# Patient Record
Sex: Male | Born: 1965 | Race: White | Hispanic: No | Smoking: Never smoker
Health system: Southern US, Community
[De-identification: ages and names within clinical notes are randomized; demographics above are authoritative.]

## PROBLEM LIST (undated history)

## (undated) DIAGNOSIS — I10 Essential (primary) hypertension: Secondary | ICD-10-CM

## (undated) DIAGNOSIS — E119 Type 2 diabetes mellitus without complications: Secondary | ICD-10-CM

## (undated) DIAGNOSIS — I2510 Atherosclerotic heart disease of native coronary artery without angina pectoris: Secondary | ICD-10-CM

## (undated) DIAGNOSIS — N289 Disorder of kidney and ureter, unspecified: Secondary | ICD-10-CM

---

## 2016-10-11 ENCOUNTER — Emergency Department (HOSPITAL_COMMUNITY): Payer: Self-pay

## 2016-10-11 ENCOUNTER — Encounter (HOSPITAL_COMMUNITY): Payer: Self-pay

## 2016-10-11 ENCOUNTER — Emergency Department (HOSPITAL_COMMUNITY)
Admission: EM | Admit: 2016-10-11 | Discharge: 2016-10-11 | Disposition: A | Discharge status: Home / Self Care | Payer: Self-pay | Attending: Emergency Medicine | Admitting: Emergency Medicine

## 2016-10-11 DIAGNOSIS — Z992 Dependence on renal dialysis: Secondary | ICD-10-CM | POA: Insufficient documentation

## 2016-10-11 DIAGNOSIS — I2510 Atherosclerotic heart disease of native coronary artery without angina pectoris: Secondary | ICD-10-CM | POA: Insufficient documentation

## 2016-10-11 DIAGNOSIS — R600 Localized edema: Secondary | ICD-10-CM | POA: Insufficient documentation

## 2016-10-11 DIAGNOSIS — E1122 Type 2 diabetes mellitus with diabetic chronic kidney disease: Secondary | ICD-10-CM | POA: Insufficient documentation

## 2016-10-11 DIAGNOSIS — N186 End stage renal disease: Secondary | ICD-10-CM | POA: Insufficient documentation

## 2016-10-11 DIAGNOSIS — I120 Hypertensive chronic kidney disease with stage 5 chronic kidney disease or end stage renal disease: Secondary | ICD-10-CM | POA: Insufficient documentation

## 2016-10-11 HISTORY — DX: Disorder of kidney and ureter, unspecified: N28.9

## 2016-10-11 HISTORY — DX: Essential (primary) hypertension: I10

## 2016-10-11 HISTORY — DX: Type 2 diabetes mellitus without complications: E11.9

## 2016-10-11 HISTORY — DX: Atherosclerotic heart disease of native coronary artery without angina pectoris: I25.10

## 2016-10-11 LAB — BASIC METABOLIC PANEL
Anion gap: 8 (ref 5–15)
BUN: 29 mg/dL — AB (ref 6–20)
CALCIUM: 8.5 mg/dL — AB (ref 8.9–10.3)
CHLORIDE: 105 mmol/L (ref 101–111)
CO2: 23 mmol/L (ref 22–32)
CREATININE: 3.05 mg/dL — AB (ref 0.61–1.24)
GFR calc Af Amer: 26 mL/min — ABNORMAL LOW (ref >60)
GFR calc non Af Amer: 22 mL/min — ABNORMAL LOW (ref >60)
Glucose, Bld: 148 mg/dL — ABNORMAL HIGH (ref 65–99)
Potassium: 3.1 mmol/L — ABNORMAL LOW (ref 3.5–5.1)
SODIUM: 136 mmol/L (ref 135–145)

## 2016-10-11 LAB — CBC
HEMATOCRIT: 25.1 % — AB (ref 39.0–52.0)
HEMOGLOBIN: 8.6 g/dL — AB (ref 13.0–17.0)
MCH: 33.2 pg (ref 26.0–34.0)
MCHC: 34.3 g/dL (ref 30.0–36.0)
MCV: 96.9 fL (ref 78.0–100.0)
Platelets: 82 10*3/uL — ABNORMAL LOW (ref 150–400)
RBC: 2.59 MIL/uL — ABNORMAL LOW (ref 4.22–5.81)
RDW: 14.7 % (ref 11.5–15.5)
WBC: 2.7 10*3/uL — ABNORMAL LOW (ref 4.0–10.5)

## 2016-10-11 NOTE — ED Notes (Signed)
Patient transported to X-ray 

## 2016-10-11 NOTE — ED Provider Notes (Signed)
[facility]-EMERGENCY DEPT Provider Note   CSN: [id number] Arrival date & time: 10/11/16  1732     History   Chief Complaint Chief Complaint  Patient presents with  . Foot Pain    HPI Nicholas Orr is a 50 y.o. male.  HPI  Pt with hx of renal failure on dialysis presents with co bilateral foot pain. Pt states he gets dialysis in [place] and lives in [place].  He currently came to the [place] area to surprise a friend.  Thinks he will move to [place].  Has missed approx 1 week of dialysis and is currently homeless.  Pt states the bottoms of both of his feet hurt- he has been doing a lot of walking.  Lower extrmities are swollen but he states not more than his usual.  Denies shortness of breath.  No fever/no chest pain. No vomiting.  There are no other associated systemic symptoms, there are no other alleviating or modifying factors.  Bottoms of feet feel like they are sore- has been going on the past few days. No specific injury or break in skin.    Past Medical History:  Diagnosis Date  . Coronary artery disease   . Diabetes mellitus without complication (HCC)   . Hypertension   . Renal disorder     There are no active problems to display for this patient.   No past surgical history on file.     Home Medications    Prior to Admission medications   Not on File    Family History History reviewed. No pertinent family history.  Social History Social History  Substance Use Topics  . Smoking status: Never Smoker  . Smokeless tobacco: Never Used  . Alcohol use No     Allergies   Patient has no known allergies.   Review of Systems Review of Systems  ROS reviewed and all otherwise negative except for mentioned in HPI   Physical Exam Updated Vital Signs BP 159/67 (BP Location: Left Arm)   Pulse 81   Temp 98.1 F (36.7 C) (Oral)   Resp 18   Ht 6' (1.829 m)   SpO2 100%  Vitals reviewed Physical Exam Physical Examination: General appearance - alert,  well appearing, and in no distress Mental status - alert, oriented to person, place, and time Eyes - no conjunctival injection ,no scleral icterus Chest - clear to auscultation, no wheezes, rales or rhonchi, symmetric air entry, normal respiratory effort Heart - normal rate, regular rhythm, normal S1, S2, no murmurs, rubs, clicks or gallops Abdomen - soft, nontender, nondistended, no masses or organomegaly Neurological - alert, oriented, normal speech, no focal findings Musculoskeletal - no joint tenderness, deformity or swelling, ttp diffusely over skin of soles of feet, no joint or bony point tenderness Extremities - peripheral pulses normal, 2+ pedal edema, no clubbing or cyanosis Skin - normal coloration and turgor, no rashes, no skin breakdown, skin is warm and dry  ED Treatments / Results  Labs (all labs ordered are listed, but only abnormal results are displayed) Labs Reviewed  CBC - Abnormal; Notable for the following:       Result Value   WBC 2.7 (*)    RBC 2.59 (*)    Hemoglobin 8.6 (*)    HCT 25.1 (*)    Platelets 82 (*)    All other components within normal limits  BASIC METABOLIC PANEL - Abnormal; Notable for the following:    Potassium 3.1 (*)    Glucose, Bld 148 (*)  BUN 29 (*)    Creatinine, Ser 3.05 (*)    Calcium 8.5 (*)    GFR calc non Af Amer 22 (*)    GFR calc Af Amer 26 (*)    All other components within normal limits    EKG  EKG Interpretation None       Radiology Dg Chest 2 View  Result Date: 10/11/2016 CLINICAL DATA:  Swelling and pain in both feet, missed dialysis EXAM: CHEST  2 VIEW COMPARISON:  None FINDINGS: Upper normal heart size. Mediastinal contours and pulmonary vascularity normal. Small RIGHT pleural effusion. No gross infiltrate or pneumothorax. Bones unremarkable. IMPRESSION: Small RIGHT pleural effusion without definite pulmonary infiltrate. Electronically Signed   By: Ulyses SouthwardMark  Boles M.D.   On: 10/11/2016 19:09     Procedures Procedures (including critical care time)  Medications Ordered in ED Medications - No data to display   Initial Impression / Assessment and Plan / ED Course  I have reviewed the triage vital signs and the nursing notes.  Pertinent labs & imaging results that were available during my care of the patient were reviewed by me and considered in my medical decision making (see chart for details).  Clinical Course     Pt presenting with c/o bilateral foot pain- on the soles of his feet.  He has lower extremity swelling which he states is chronic for him.  He has missed several dialysis sessions but does not have c/o shortness of breath.  Labs reveal renal failure, but low potassium.  CXR is also reassuring.  There is no skin breakdown on bottoms of feet- no signs of specific injury or cellulitis.  Skin is warm and dry- normal DP pulses in both feet. Pt has been walking more due [place] being homeless [place].  States he may be going to [place] next week.  Discharged with strict return precautions.  Pt agreeable with plan.  Final Clinical Impressions(s) / ED Diagnoses   Final diagnoses:  Bilateral lower extremity edema  End stage renal failure on dialysis [facility](HCC)    New Prescriptions There are no discharge medications for this patient.    Jerelyn ScottMartha Linker, MD 10/12/16 [phone]

## 2016-10-11 NOTE — ED Triage Notes (Signed)
Pt. Is homeless and has been outside .  Is having bilateral foot pain due to swelling .  Pt. Is also a dialysis pt. And his last treatment was last Wednesday.  Skin is pink warm and dry.   Pt. Is a diabetic and his last meal was this am.  Pt. Is alert and oriented X4.

## 2016-10-11 NOTE — Discharge Instructions (Signed)
Return to the ED with any concerns including difficulty breathing, fainting, chest pain, vomiting and not able to keep down liquids, decreased level of alertness/lethargy, or any other alarming symptoms °

## 2016-10-11 NOTE — ED Notes (Signed)
Pt is from out of town.  Last dialysis 1 week ago.  Pt c/o pain and swelling in bilateral feet.

## 2017-12-07 IMAGING — DX DG CHEST 2V
2 series · 2 of 2 positions shown · non-contrast
Comparison: None

CLINICAL DATA: Swelling and pain in both feet, missed dialysis

EXAM:
CHEST  2 VIEW

[chest pa]
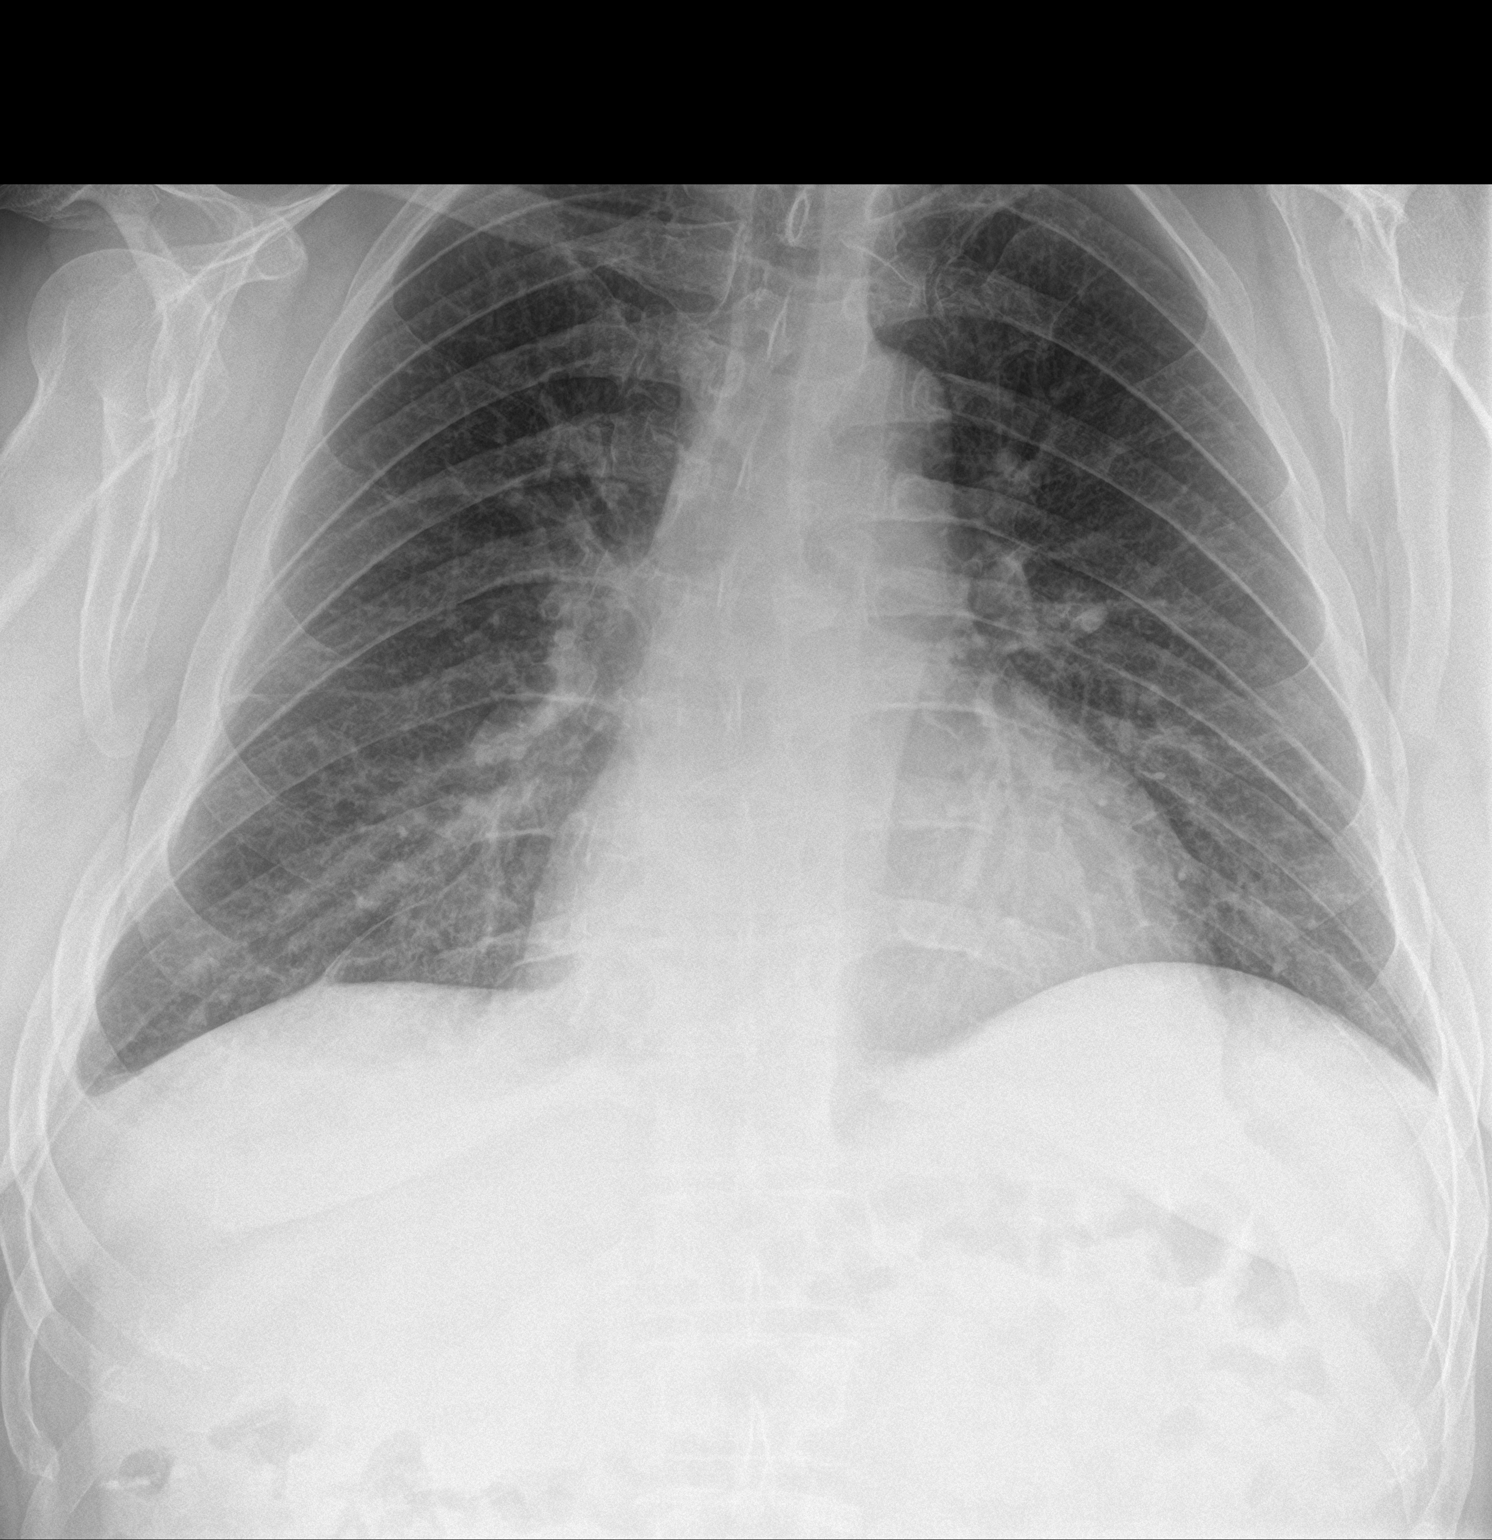

[chest lat]
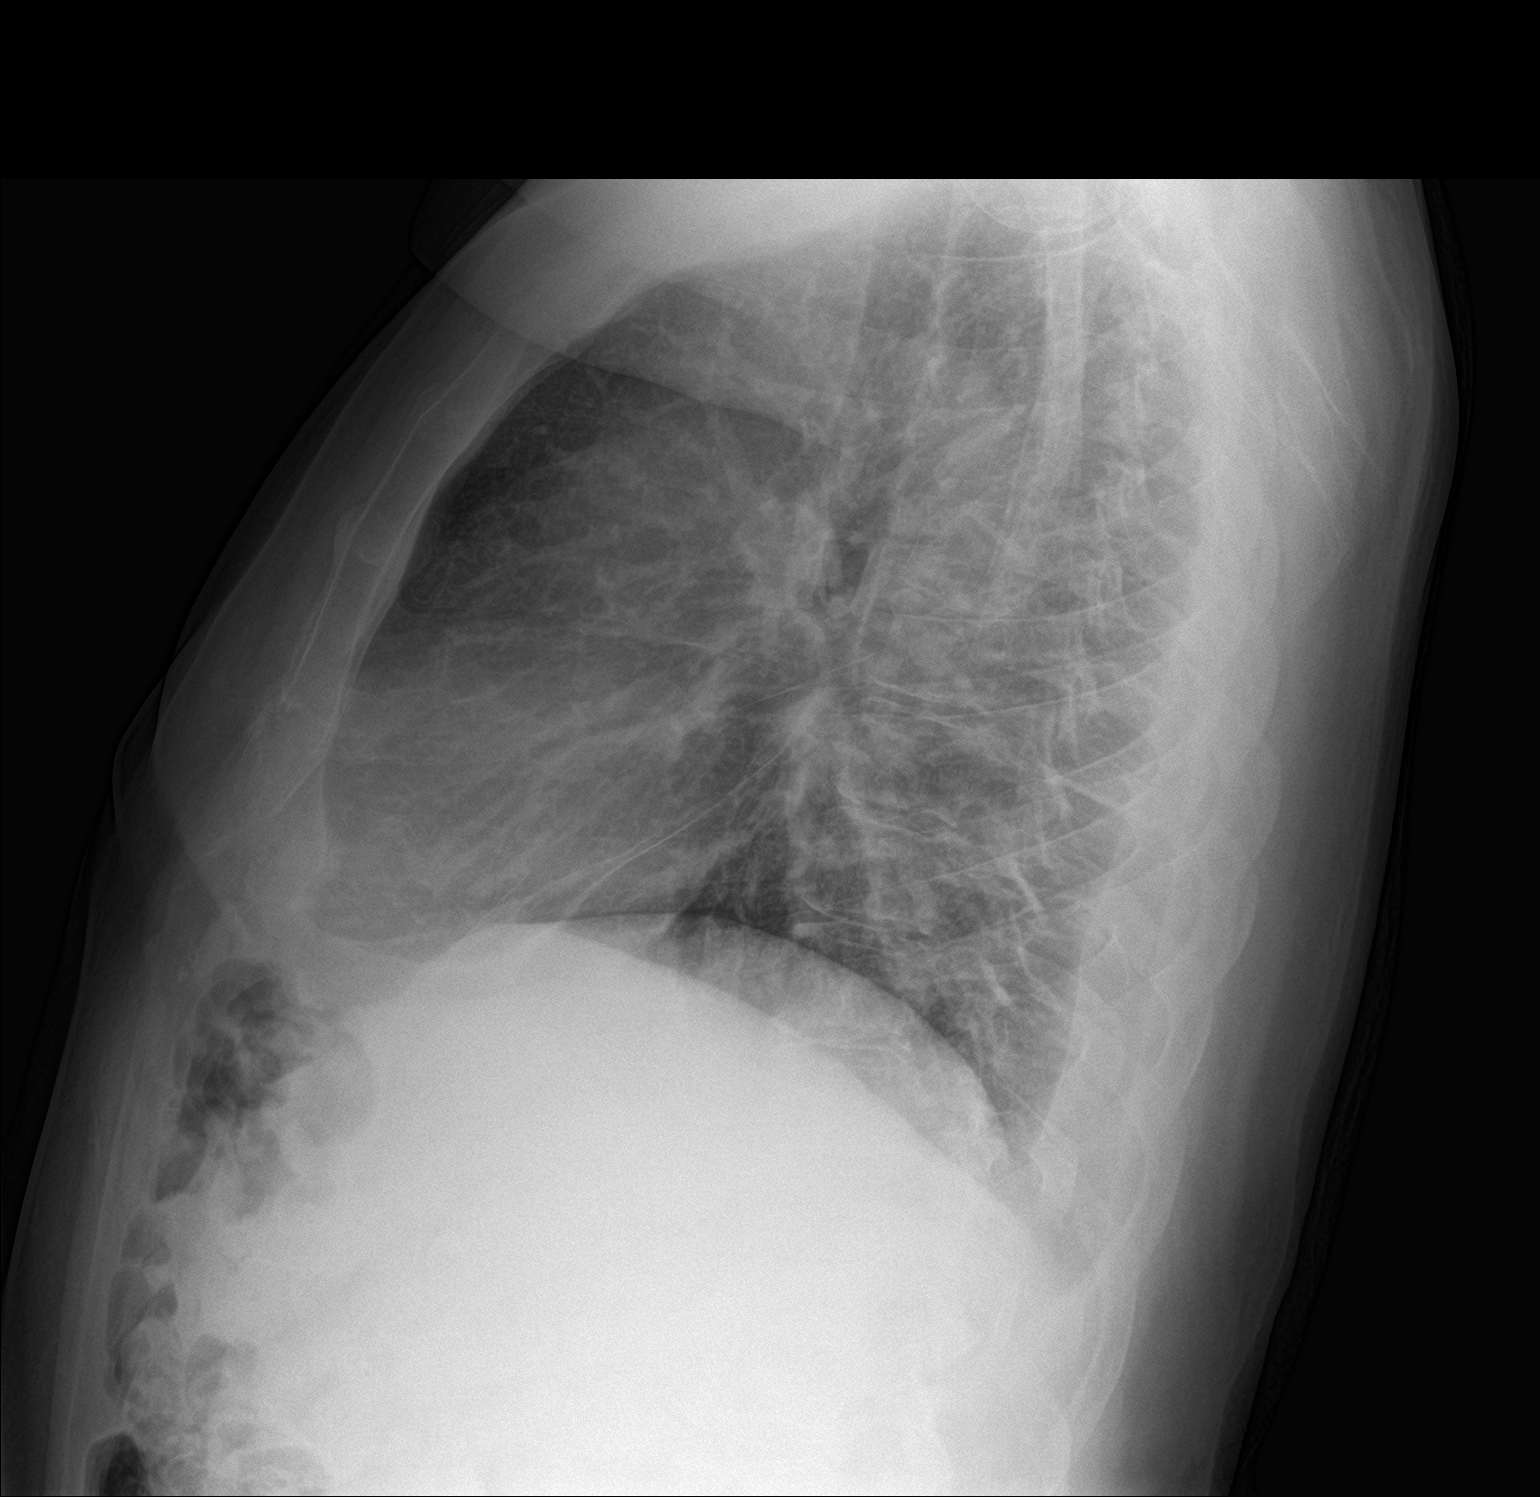

[2 of 2 positions shown; findings below may reference images not displayed]

FINDINGS: Upper normal heart size.

Mediastinal contours and pulmonary vascularity normal.

Small RIGHT pleural effusion.

No gross infiltrate or pneumothorax.

Bones unremarkable.
IMPRESSION: Small RIGHT pleural effusion without definite pulmonary infiltrate.
